# Patient Record
Sex: Female | Born: 1985 | Race: White | Hispanic: No | Marital: Married | Smoking: Never smoker
Health system: Southern US, Community
[De-identification: ages and names within clinical notes are randomized; demographics above are authoritative.]

## PROBLEM LIST (undated history)

## (undated) ENCOUNTER — Ambulatory Visit

## (undated) DIAGNOSIS — F32A Depression, unspecified: Secondary | ICD-10-CM

## (undated) DIAGNOSIS — F329 Major depressive disorder, single episode, unspecified: Secondary | ICD-10-CM

---

## 2011-10-02 ENCOUNTER — Other Ambulatory Visit (HOSPITAL_COMMUNITY)
Admission: RE | Admit: 2011-10-02 | Discharge: 2011-10-02 | Disposition: A | Discharge status: Home / Self Care | Payer: Self-pay | Source: Ambulatory Visit | Attending: Obstetrics and Gynecology | Admitting: Obstetrics and Gynecology

## 2011-10-02 DIAGNOSIS — Z01419 Encounter for gynecological examination (general) (routine) without abnormal findings: Secondary | ICD-10-CM | POA: Insufficient documentation

## 2016-08-18 ENCOUNTER — Ambulatory Visit (HOSPITAL_COMMUNITY)
Admission: EM | Admit: 2016-08-18 | Discharge: 2016-08-18 | Disposition: A | Discharge status: Home / Self Care | Payer: BLUE CROSS/BLUE SHIELD | Attending: Family Medicine | Admitting: Family Medicine

## 2016-08-18 ENCOUNTER — Encounter (HOSPITAL_COMMUNITY): Payer: Self-pay | Admitting: Emergency Medicine

## 2016-08-18 DIAGNOSIS — J069 Acute upper respiratory infection, unspecified: Secondary | ICD-10-CM

## 2016-08-18 DIAGNOSIS — R599 Enlarged lymph nodes, unspecified: Secondary | ICD-10-CM

## 2016-08-18 DIAGNOSIS — B349 Viral infection, unspecified: Secondary | ICD-10-CM | POA: Diagnosis not present

## 2016-08-18 DIAGNOSIS — B9789 Other viral agents as the cause of diseases classified elsewhere: Secondary | ICD-10-CM | POA: Diagnosis not present

## 2016-08-18 HISTORY — DX: Depression, unspecified: F32.A

## 2016-08-18 HISTORY — DX: Major depressive disorder, single episode, unspecified: F32.9

## 2016-08-18 MED ORDER — ACETAMINOPHEN 325 MG PO TABS
650.0000 mg | ORAL_TABLET | Freq: Once | ORAL | Status: AC
  Administered 2016-08-18: 650 mg via ORAL

## 2016-08-18 MED ORDER — ACETAMINOPHEN 325 MG PO TABS
ORAL_TABLET | ORAL | Status: AC
  Filled 2016-08-18: qty 2

## 2016-08-18 NOTE — Discharge Instructions (Signed)
APPLY HEAT  USE IBUPROFEN  FOLLOW UP WITH YOUR DOCTOR IF NEW OR WORSENING OF SYMPTOMS.

## 2016-08-18 NOTE — ED Provider Notes (Signed)
CSN: [id number]     Arrival date & time 08/18/16  1840 History   First MD Initiated Contact with Patient 08/18/16 1959     Chief Complaint  Patient presents with  . Fever   (Consider location/radiation/quality/duration/timing/severity/associated sxs/prior Treatment) HPI THIS IS A NEW PROBLEM. ONSET OF FEVER AND BODY ACHES THAT STARTED TONIGHT. ASSOC WITH BODY ACHES. NO AGGRAVATING OR ALLEVIATING FACTORS. NO TREATMENT AT HOME PAIN SCORE. FAMILY HISTORY: Past Medical History:  Diagnosis Date  . Depression    History reviewed. No pertinent surgical history. History reviewed. No pertinent family history. Social History  Substance Use Topics  . Smoking status: Never Smoker  . Smokeless tobacco: Never Used  . Alcohol use Yes     Comment: occassional   OB History    No data available     Review of Systems  Denies: HEADACHE, NAUSEA, ABDOMINAL PAIN, CHEST PAIN, CONGESTION, DYSURIA, SHORTNESS OF BREATH  Allergies  Review of patient's allergies indicates no known allergies.  Home Medications   Prior to Admission medications   Medication Sig Start Date End Date Taking? Authorizing Provider  escitalopram (LEXAPRO) 10 MG tablet Take 10 mg by mouth daily.   Yes Historical Provider, MD  Norethin Ace-Eth Estrad-FE (LOESTRIN 24 FE PO) Take by mouth.   Yes Historical Provider, MD   Meds Ordered and Administered this Visit  Medications - No data to display  BP 140/93 (BP Location: Left Arm)   Pulse 100   Temp 100.5 F (38.1 C) (Oral)   Resp 16   LMP  (Exact Date)   SpO2 100%  No data found.   Physical Exam NURSES NOTES AND VITAL SIGNS REVIEWED. CONSTITUTIONAL: Well developed, well nourished, no acute distress HEENT: normocephalic, atraumatic, SWOLLEN TENDER POST AURICULAR NODE ON LEFT EYES: Conjunctiva normal NECK:normal ROM, supple, no adenopathy PULMONARY:No respiratory distress, normal effort ABDOMINAL: Soft, ND, NT BS+, No CVAT MUSCULOSKELETAL: Normal ROM of all  extremities,  SKIN: warm and dry without rash PSYCHIATRIC: Mood and affect, behavior are normal  Urgent Care Course   Clinical Course    Procedures (including critical care time)  Labs Review Labs Reviewed - No data to display  Imaging Review No results found.   Visual Acuity Review  Right Eye Distance:   Left Eye Distance:   Bilateral Distance:    Right Eye Near:   Left Eye Near:    Bilateral Near:         MDM   1. Viral illness   2. Viral upper respiratory tract infection   3. Swelling of lymph node     Patient is reassured that there are no issues that require transfer to higher level of care at this time or additional tests. Patient is advised to continue home symptomatic treatment. Patient is advised that if there are new or worsening symptoms to attend the emergency department, contact primary care provider, or return to UC. Instructions of care provided discharged home in stable condition.    THIS NOTE WAS GENERATED USING A VOICE RECOGNITION SOFTWARE PROGRAM. ALL REASONABLE EFFORTS  WERE MADE TO PROOFREAD THIS DOCUMENT FOR ACCURACY.  I have verbally reviewed the discharge instructions with the patient. A printed AVS was given to the patient.  All questions were answered prior to discharge.      Tharon AquasFrank C Patrick, PA 08/18/16 2019

## 2016-08-18 NOTE — ED Triage Notes (Signed)
[facility] a [facility] fever and general body aches especially in her neck that started this evening. The patient reported no meds on board for the fever.

## 2016-09-01 DIAGNOSIS — F331 Major depressive disorder, recurrent, moderate: Secondary | ICD-10-CM | POA: Diagnosis not present

## 2016-09-21 DIAGNOSIS — F4323 Adjustment disorder with mixed anxiety and depressed mood: Secondary | ICD-10-CM | POA: Diagnosis not present

## 2016-10-05 DIAGNOSIS — F4323 Adjustment disorder with mixed anxiety and depressed mood: Secondary | ICD-10-CM | POA: Diagnosis not present

## 2016-10-19 DIAGNOSIS — F4323 Adjustment disorder with mixed anxiety and depressed mood: Secondary | ICD-10-CM | POA: Diagnosis not present

## 2016-11-02 DIAGNOSIS — F4323 Adjustment disorder with mixed anxiety and depressed mood: Secondary | ICD-10-CM | POA: Diagnosis not present

## 2016-11-17 DIAGNOSIS — J019 Acute sinusitis, unspecified: Secondary | ICD-10-CM | POA: Diagnosis not present

## 2016-11-20 DIAGNOSIS — F4323 Adjustment disorder with mixed anxiety and depressed mood: Secondary | ICD-10-CM | POA: Diagnosis not present

## 2016-11-30 DIAGNOSIS — F4323 Adjustment disorder with mixed anxiety and depressed mood: Secondary | ICD-10-CM | POA: Diagnosis not present

## 2016-12-07 DIAGNOSIS — J069 Acute upper respiratory infection, unspecified: Secondary | ICD-10-CM | POA: Diagnosis not present

## 2016-12-07 DIAGNOSIS — F331 Major depressive disorder, recurrent, moderate: Secondary | ICD-10-CM | POA: Diagnosis not present

## 2016-12-14 DIAGNOSIS — F4323 Adjustment disorder with mixed anxiety and depressed mood: Secondary | ICD-10-CM | POA: Diagnosis not present

## 2016-12-24 DIAGNOSIS — Z6827 Body mass index (BMI) 27.0-27.9, adult: Secondary | ICD-10-CM | POA: Diagnosis not present

## 2016-12-24 DIAGNOSIS — Z309 Encounter for contraceptive management, unspecified: Secondary | ICD-10-CM | POA: Diagnosis not present

## 2016-12-24 DIAGNOSIS — Z01419 Encounter for gynecological examination (general) (routine) without abnormal findings: Secondary | ICD-10-CM | POA: Diagnosis not present

## 2017-01-11 DIAGNOSIS — F4323 Adjustment disorder with mixed anxiety and depressed mood: Secondary | ICD-10-CM | POA: Diagnosis not present

## 2017-02-02 DIAGNOSIS — F4323 Adjustment disorder with mixed anxiety and depressed mood: Secondary | ICD-10-CM | POA: Diagnosis not present

## 2017-02-15 DIAGNOSIS — F4323 Adjustment disorder with mixed anxiety and depressed mood: Secondary | ICD-10-CM | POA: Diagnosis not present

## 2017-03-02 DIAGNOSIS — F4323 Adjustment disorder with mixed anxiety and depressed mood: Secondary | ICD-10-CM | POA: Diagnosis not present

## 2017-03-24 DIAGNOSIS — L814 Other melanin hyperpigmentation: Secondary | ICD-10-CM | POA: Diagnosis not present

## 2017-03-24 DIAGNOSIS — D225 Melanocytic nevi of trunk: Secondary | ICD-10-CM | POA: Diagnosis not present

## 2017-03-24 DIAGNOSIS — L821 Other seborrheic keratosis: Secondary | ICD-10-CM | POA: Diagnosis not present

## 2017-03-24 DIAGNOSIS — D1800 Hemangioma unspecified site: Secondary | ICD-10-CM | POA: Diagnosis not present

## 2017-04-01 DIAGNOSIS — F4323 Adjustment disorder with mixed anxiety and depressed mood: Secondary | ICD-10-CM | POA: Diagnosis not present

## 2017-04-14 DIAGNOSIS — F4323 Adjustment disorder with mixed anxiety and depressed mood: Secondary | ICD-10-CM | POA: Diagnosis not present

## 2017-05-04 DIAGNOSIS — F4323 Adjustment disorder with mixed anxiety and depressed mood: Secondary | ICD-10-CM | POA: Diagnosis not present

## 2017-06-07 DIAGNOSIS — F331 Major depressive disorder, recurrent, moderate: Secondary | ICD-10-CM | POA: Diagnosis not present

## 2017-06-22 DIAGNOSIS — F4323 Adjustment disorder with mixed anxiety and depressed mood: Secondary | ICD-10-CM | POA: Diagnosis not present

## 2017-07-12 DIAGNOSIS — F4323 Adjustment disorder with mixed anxiety and depressed mood: Secondary | ICD-10-CM | POA: Diagnosis not present

## 2017-07-26 DIAGNOSIS — F4323 Adjustment disorder with mixed anxiety and depressed mood: Secondary | ICD-10-CM | POA: Diagnosis not present

## 2017-08-17 DIAGNOSIS — F4323 Adjustment disorder with mixed anxiety and depressed mood: Secondary | ICD-10-CM | POA: Diagnosis not present

## 2017-10-15 DIAGNOSIS — J302 Other seasonal allergic rhinitis: Secondary | ICD-10-CM | POA: Diagnosis not present

## 2017-11-15 DIAGNOSIS — R300 Dysuria: Secondary | ICD-10-CM | POA: Diagnosis not present

## 2017-12-22 DIAGNOSIS — N390 Urinary tract infection, site not specified: Secondary | ICD-10-CM | POA: Diagnosis not present

## 2017-12-22 DIAGNOSIS — F331 Major depressive disorder, recurrent, moderate: Secondary | ICD-10-CM | POA: Diagnosis not present

## 2018-03-30 DIAGNOSIS — L821 Other seborrheic keratosis: Secondary | ICD-10-CM | POA: Diagnosis not present

## 2018-03-30 DIAGNOSIS — D1800 Hemangioma unspecified site: Secondary | ICD-10-CM | POA: Diagnosis not present

## 2018-03-30 DIAGNOSIS — L814 Other melanin hyperpigmentation: Secondary | ICD-10-CM | POA: Diagnosis not present

## 2018-03-30 DIAGNOSIS — D225 Melanocytic nevi of trunk: Secondary | ICD-10-CM | POA: Diagnosis not present

## 2018-06-21 DIAGNOSIS — F331 Major depressive disorder, recurrent, moderate: Secondary | ICD-10-CM | POA: Diagnosis not present

## 2018-06-21 DIAGNOSIS — Z23 Encounter for immunization: Secondary | ICD-10-CM | POA: Diagnosis not present

## 2018-12-08 DIAGNOSIS — J00 Acute nasopharyngitis [common cold]: Secondary | ICD-10-CM | POA: Diagnosis not present

## 2018-12-26 DIAGNOSIS — F331 Major depressive disorder, recurrent, moderate: Secondary | ICD-10-CM | POA: Diagnosis not present

## 2019-06-06 DIAGNOSIS — B078 Other viral warts: Secondary | ICD-10-CM | POA: Diagnosis not present

## 2019-06-06 DIAGNOSIS — L811 Chloasma: Secondary | ICD-10-CM | POA: Diagnosis not present

## 2019-06-06 DIAGNOSIS — D225 Melanocytic nevi of trunk: Secondary | ICD-10-CM | POA: Diagnosis not present

## 2019-06-06 DIAGNOSIS — L814 Other melanin hyperpigmentation: Secondary | ICD-10-CM | POA: Diagnosis not present

## 2019-06-06 DIAGNOSIS — D1801 Hemangioma of skin and subcutaneous tissue: Secondary | ICD-10-CM | POA: Diagnosis not present

## 2019-06-08 DIAGNOSIS — Z1159 Encounter for screening for other viral diseases: Secondary | ICD-10-CM | POA: Diagnosis not present

## 2019-06-26 DIAGNOSIS — F331 Major depressive disorder, recurrent, moderate: Secondary | ICD-10-CM | POA: Diagnosis not present

## 2019-07-03 DIAGNOSIS — B078 Other viral warts: Secondary | ICD-10-CM | POA: Diagnosis not present

## 2019-08-08 ENCOUNTER — Other Ambulatory Visit: Payer: Self-pay

## 2019-08-08 DIAGNOSIS — R6889 Other general symptoms and signs: Secondary | ICD-10-CM | POA: Diagnosis not present

## 2019-08-08 DIAGNOSIS — Z20822 Contact with and (suspected) exposure to covid-19: Secondary | ICD-10-CM

## 2019-08-09 LAB — NOVEL CORONAVIRUS, NAA: SARS-CoV-2, NAA: NOT DETECTED

## 2019-08-15 ENCOUNTER — Other Ambulatory Visit: Payer: Self-pay

## 2019-08-15 DIAGNOSIS — Z20822 Contact with and (suspected) exposure to covid-19: Secondary | ICD-10-CM

## 2019-08-16 LAB — NOVEL CORONAVIRUS, NAA: SARS-CoV-2, NAA: NOT DETECTED

## 2019-09-05 DIAGNOSIS — B078 Other viral warts: Secondary | ICD-10-CM | POA: Diagnosis not present

## 2019-12-12 DIAGNOSIS — L821 Other seborrheic keratosis: Secondary | ICD-10-CM | POA: Diagnosis not present

## 2019-12-12 DIAGNOSIS — B078 Other viral warts: Secondary | ICD-10-CM | POA: Diagnosis not present

## 2019-12-12 DIAGNOSIS — D2372 Other benign neoplasm of skin of left lower limb, including hip: Secondary | ICD-10-CM | POA: Diagnosis not present

## 2020-01-01 DIAGNOSIS — F3342 Major depressive disorder, recurrent, in full remission: Secondary | ICD-10-CM | POA: Diagnosis not present

## 2020-01-15 ENCOUNTER — Ambulatory Visit: Payer: BC Managed Care – PPO | Attending: Internal Medicine

## 2020-01-15 DIAGNOSIS — Z20822 Contact with and (suspected) exposure to covid-19: Secondary | ICD-10-CM | POA: Diagnosis not present

## 2020-01-17 LAB — NOVEL CORONAVIRUS, NAA: SARS-CoV-2, NAA: NOT DETECTED

## 2020-01-30 DIAGNOSIS — B078 Other viral warts: Secondary | ICD-10-CM | POA: Diagnosis not present

## 2020-01-30 DIAGNOSIS — L820 Inflamed seborrheic keratosis: Secondary | ICD-10-CM | POA: Diagnosis not present

## 2020-02-29 DIAGNOSIS — Z01419 Encounter for gynecological examination (general) (routine) without abnormal findings: Secondary | ICD-10-CM | POA: Diagnosis not present

## 2020-02-29 DIAGNOSIS — Z6832 Body mass index (BMI) 32.0-32.9, adult: Secondary | ICD-10-CM | POA: Diagnosis not present

## 2020-02-29 DIAGNOSIS — N941 Unspecified dyspareunia: Secondary | ICD-10-CM | POA: Diagnosis not present

## 2020-02-29 DIAGNOSIS — F419 Anxiety disorder, unspecified: Secondary | ICD-10-CM | POA: Diagnosis not present

## 2020-03-04 DIAGNOSIS — B078 Other viral warts: Secondary | ICD-10-CM | POA: Diagnosis not present

## 2020-03-08 DIAGNOSIS — N941 Unspecified dyspareunia: Secondary | ICD-10-CM | POA: Diagnosis not present

## 2020-03-08 DIAGNOSIS — K5900 Constipation, unspecified: Secondary | ICD-10-CM | POA: Diagnosis not present

## 2020-03-08 DIAGNOSIS — M62838 Other muscle spasm: Secondary | ICD-10-CM | POA: Diagnosis not present

## 2020-03-15 DIAGNOSIS — N941 Unspecified dyspareunia: Secondary | ICD-10-CM | POA: Diagnosis not present

## 2020-03-15 DIAGNOSIS — M62838 Other muscle spasm: Secondary | ICD-10-CM | POA: Diagnosis not present

## 2020-03-15 DIAGNOSIS — K5900 Constipation, unspecified: Secondary | ICD-10-CM | POA: Diagnosis not present

## 2020-03-22 DIAGNOSIS — N941 Unspecified dyspareunia: Secondary | ICD-10-CM | POA: Diagnosis not present

## 2020-03-22 DIAGNOSIS — M62838 Other muscle spasm: Secondary | ICD-10-CM | POA: Diagnosis not present

## 2020-03-22 DIAGNOSIS — K5900 Constipation, unspecified: Secondary | ICD-10-CM | POA: Diagnosis not present

## 2020-03-29 DIAGNOSIS — K5900 Constipation, unspecified: Secondary | ICD-10-CM | POA: Diagnosis not present

## 2020-03-29 DIAGNOSIS — N941 Unspecified dyspareunia: Secondary | ICD-10-CM | POA: Diagnosis not present

## 2020-03-29 DIAGNOSIS — M62838 Other muscle spasm: Secondary | ICD-10-CM | POA: Diagnosis not present

## 2020-04-10 DIAGNOSIS — M62838 Other muscle spasm: Secondary | ICD-10-CM | POA: Diagnosis not present

## 2020-04-10 DIAGNOSIS — K5900 Constipation, unspecified: Secondary | ICD-10-CM | POA: Diagnosis not present

## 2020-04-10 DIAGNOSIS — N941 Unspecified dyspareunia: Secondary | ICD-10-CM | POA: Diagnosis not present

## 2020-04-16 DIAGNOSIS — K5900 Constipation, unspecified: Secondary | ICD-10-CM | POA: Diagnosis not present

## 2020-04-16 DIAGNOSIS — N941 Unspecified dyspareunia: Secondary | ICD-10-CM | POA: Diagnosis not present

## 2020-04-16 DIAGNOSIS — M62838 Other muscle spasm: Secondary | ICD-10-CM | POA: Diagnosis not present

## 2020-04-22 DIAGNOSIS — K5900 Constipation, unspecified: Secondary | ICD-10-CM | POA: Diagnosis not present

## 2020-04-22 DIAGNOSIS — M62838 Other muscle spasm: Secondary | ICD-10-CM | POA: Diagnosis not present

## 2020-04-22 DIAGNOSIS — N941 Unspecified dyspareunia: Secondary | ICD-10-CM | POA: Diagnosis not present

## 2020-04-29 DIAGNOSIS — M62838 Other muscle spasm: Secondary | ICD-10-CM | POA: Diagnosis not present

## 2020-04-29 DIAGNOSIS — K5900 Constipation, unspecified: Secondary | ICD-10-CM | POA: Diagnosis not present

## 2020-04-29 DIAGNOSIS — N941 Unspecified dyspareunia: Secondary | ICD-10-CM | POA: Diagnosis not present

## 2020-04-30 DIAGNOSIS — L728 Other follicular cysts of the skin and subcutaneous tissue: Secondary | ICD-10-CM | POA: Diagnosis not present

## 2020-04-30 DIAGNOSIS — B078 Other viral warts: Secondary | ICD-10-CM | POA: Diagnosis not present

## 2020-05-13 ENCOUNTER — Ambulatory Visit: Payer: BC Managed Care – PPO | Attending: Internal Medicine

## 2020-05-13 DIAGNOSIS — Z20822 Contact with and (suspected) exposure to covid-19: Secondary | ICD-10-CM

## 2020-05-14 LAB — NOVEL CORONAVIRUS, NAA: SARS-CoV-2, NAA: NOT DETECTED

## 2020-05-14 LAB — SARS-COV-2, NAA 2 DAY TAT

## 2020-05-15 DIAGNOSIS — K5900 Constipation, unspecified: Secondary | ICD-10-CM | POA: Diagnosis not present

## 2020-05-15 DIAGNOSIS — M62838 Other muscle spasm: Secondary | ICD-10-CM | POA: Diagnosis not present

## 2020-05-15 DIAGNOSIS — N941 Unspecified dyspareunia: Secondary | ICD-10-CM | POA: Diagnosis not present

## 2020-05-29 DIAGNOSIS — M62838 Other muscle spasm: Secondary | ICD-10-CM | POA: Diagnosis not present

## 2020-05-29 DIAGNOSIS — N941 Unspecified dyspareunia: Secondary | ICD-10-CM | POA: Diagnosis not present

## 2020-05-29 DIAGNOSIS — K5900 Constipation, unspecified: Secondary | ICD-10-CM | POA: Diagnosis not present

## 2020-06-10 DIAGNOSIS — N941 Unspecified dyspareunia: Secondary | ICD-10-CM | POA: Diagnosis not present

## 2020-06-10 DIAGNOSIS — K5900 Constipation, unspecified: Secondary | ICD-10-CM | POA: Diagnosis not present

## 2020-06-10 DIAGNOSIS — M62838 Other muscle spasm: Secondary | ICD-10-CM | POA: Diagnosis not present

## 2020-06-19 DIAGNOSIS — K5900 Constipation, unspecified: Secondary | ICD-10-CM | POA: Diagnosis not present

## 2020-06-19 DIAGNOSIS — M62838 Other muscle spasm: Secondary | ICD-10-CM | POA: Diagnosis not present

## 2020-06-19 DIAGNOSIS — N941 Unspecified dyspareunia: Secondary | ICD-10-CM | POA: Diagnosis not present

## 2020-06-26 DIAGNOSIS — N941 Unspecified dyspareunia: Secondary | ICD-10-CM | POA: Diagnosis not present

## 2020-06-26 DIAGNOSIS — M62838 Other muscle spasm: Secondary | ICD-10-CM | POA: Diagnosis not present

## 2020-06-26 DIAGNOSIS — K5900 Constipation, unspecified: Secondary | ICD-10-CM | POA: Diagnosis not present

## 2020-07-17 DIAGNOSIS — N941 Unspecified dyspareunia: Secondary | ICD-10-CM | POA: Diagnosis not present

## 2020-07-17 DIAGNOSIS — M62838 Other muscle spasm: Secondary | ICD-10-CM | POA: Diagnosis not present

## 2020-07-17 DIAGNOSIS — K5900 Constipation, unspecified: Secondary | ICD-10-CM | POA: Diagnosis not present

## 2020-09-12 DIAGNOSIS — Z20822 Contact with and (suspected) exposure to covid-19: Secondary | ICD-10-CM | POA: Diagnosis not present

## 2020-10-11 DIAGNOSIS — R079 Chest pain, unspecified: Secondary | ICD-10-CM | POA: Diagnosis not present

## 2020-10-16 DIAGNOSIS — Z20822 Contact with and (suspected) exposure to covid-19: Secondary | ICD-10-CM | POA: Diagnosis not present

## 2020-10-23 DIAGNOSIS — J0140 Acute pansinusitis, unspecified: Secondary | ICD-10-CM | POA: Diagnosis not present

## 2020-10-23 DIAGNOSIS — R059 Cough, unspecified: Secondary | ICD-10-CM | POA: Diagnosis not present

## 2020-10-23 DIAGNOSIS — R5383 Other fatigue: Secondary | ICD-10-CM | POA: Diagnosis not present

## 2020-10-23 DIAGNOSIS — Z03818 Encounter for observation for suspected exposure to other biological agents ruled out: Secondary | ICD-10-CM | POA: Diagnosis not present

## 2020-10-23 DIAGNOSIS — Z20822 Contact with and (suspected) exposure to covid-19: Secondary | ICD-10-CM | POA: Diagnosis not present

## 2020-10-25 DIAGNOSIS — F3342 Major depressive disorder, recurrent, in full remission: Secondary | ICD-10-CM | POA: Diagnosis not present

## 2020-11-04 ENCOUNTER — Other Ambulatory Visit: Payer: BC Managed Care – PPO

## 2020-11-04 DIAGNOSIS — Z20822 Contact with and (suspected) exposure to covid-19: Secondary | ICD-10-CM | POA: Diagnosis not present

## 2020-11-06 LAB — SARS-COV-2, NAA 2 DAY TAT

## 2020-11-06 LAB — NOVEL CORONAVIRUS, NAA: SARS-CoV-2, NAA: NOT DETECTED

## 2021-04-18 DIAGNOSIS — T7840XA Allergy, unspecified, initial encounter: Secondary | ICD-10-CM | POA: Diagnosis not present

## 2021-04-18 DIAGNOSIS — F3341 Major depressive disorder, recurrent, in partial remission: Secondary | ICD-10-CM | POA: Diagnosis not present

## 2021-06-09 DIAGNOSIS — Z20822 Contact with and (suspected) exposure to covid-19: Secondary | ICD-10-CM | POA: Diagnosis not present

## 2021-08-13 DIAGNOSIS — L814 Other melanin hyperpigmentation: Secondary | ICD-10-CM | POA: Diagnosis not present

## 2021-08-13 DIAGNOSIS — D235 Other benign neoplasm of skin of trunk: Secondary | ICD-10-CM | POA: Diagnosis not present

## 2021-08-13 DIAGNOSIS — D225 Melanocytic nevi of trunk: Secondary | ICD-10-CM | POA: Diagnosis not present

## 2021-08-13 DIAGNOSIS — D485 Neoplasm of uncertain behavior of skin: Secondary | ICD-10-CM | POA: Diagnosis not present

## 2021-08-13 DIAGNOSIS — D179 Benign lipomatous neoplasm, unspecified: Secondary | ICD-10-CM | POA: Diagnosis not present

## 2021-08-13 DIAGNOSIS — D2372 Other benign neoplasm of skin of left lower limb, including hip: Secondary | ICD-10-CM | POA: Diagnosis not present

## 2021-08-13 DIAGNOSIS — L821 Other seborrheic keratosis: Secondary | ICD-10-CM | POA: Diagnosis not present

## 2021-10-20 DIAGNOSIS — F3341 Major depressive disorder, recurrent, in partial remission: Secondary | ICD-10-CM | POA: Diagnosis not present

## 2022-02-10 DIAGNOSIS — D2239 Melanocytic nevi of other parts of face: Secondary | ICD-10-CM | POA: Diagnosis not present

## 2022-02-10 DIAGNOSIS — D225 Melanocytic nevi of trunk: Secondary | ICD-10-CM | POA: Diagnosis not present

## 2022-02-10 DIAGNOSIS — D2271 Melanocytic nevi of right lower limb, including hip: Secondary | ICD-10-CM | POA: Diagnosis not present

## 2022-05-04 ENCOUNTER — Emergency Department (HOSPITAL_COMMUNITY): Payer: BC Managed Care – PPO

## 2022-05-04 ENCOUNTER — Emergency Department (HOSPITAL_COMMUNITY)
Admission: EM | Admit: 2022-05-04 | Discharge: 2022-05-05 | Discharge status: Against Medical Advice | Payer: BC Managed Care – PPO | Attending: Emergency Medicine | Admitting: Emergency Medicine

## 2022-05-04 ENCOUNTER — Encounter (HOSPITAL_COMMUNITY): Payer: Self-pay

## 2022-05-04 DIAGNOSIS — R109 Unspecified abdominal pain: Secondary | ICD-10-CM | POA: Diagnosis not present

## 2022-05-04 DIAGNOSIS — R0789 Other chest pain: Secondary | ICD-10-CM | POA: Diagnosis not present

## 2022-05-04 DIAGNOSIS — R079 Chest pain, unspecified: Secondary | ICD-10-CM | POA: Diagnosis not present

## 2022-05-04 DIAGNOSIS — Z5321 Procedure and treatment not carried out due to patient leaving prior to being seen by health care provider: Secondary | ICD-10-CM | POA: Insufficient documentation

## 2022-05-04 LAB — BASIC METABOLIC PANEL
Anion gap: 5 (ref 5–15)
BUN: 10 mg/dL (ref 6–20)
CO2: 25 mmol/L (ref 22–32)
Calcium: 9.2 mg/dL (ref 8.9–10.3)
Chloride: 109 mmol/L (ref 98–111)
Creatinine, Ser: 0.81 mg/dL (ref 0.44–1.00)
GFR, Estimated: >60 mL/min (ref >60)
Glucose, Bld: 108 mg/dL — ABNORMAL HIGH (ref 70–99)
Potassium: 4.2 mmol/L (ref 3.5–5.1)
Sodium: 139 mmol/L (ref 135–145)

## 2022-05-04 LAB — CBC
HCT: 38.4 % (ref 36.0–46.0)
Hemoglobin: 12.1 g/dL (ref 12.0–15.0)
MCH: 27.4 pg (ref 26.0–34.0)
MCHC: 31.5 g/dL (ref 30.0–36.0)
MCV: 87.1 fL (ref 80.0–100.0)
Platelets: 414 10*3/uL — ABNORMAL HIGH (ref 150–400)
RBC: 4.41 MIL/uL (ref 3.87–5.11)
RDW: 15.8 % — ABNORMAL HIGH (ref 11.5–15.5)
WBC: 9.2 10*3/uL (ref 4.0–10.5)
nRBC: 0 % (ref 0.0–0.2)

## 2022-05-04 LAB — I-STAT BETA HCG BLOOD, ED (MC, WL, AP ONLY): I-stat hCG, quantitative: <5 m[IU]/mL (ref <5)

## 2022-05-04 LAB — TROPONIN I (HIGH SENSITIVITY): Troponin I (High Sensitivity): <2 ng/L (ref <18)

## 2022-05-04 NOTE — ED Provider Triage Note (Signed)
Emergency Medicine Provider Triage Evaluation Note  MULKI ROESLER , a 36 y.o. female  was evaluated in triage.  Pt complains of left chest pain. She states that same was present when she woke up this morning. She states that the pain is sharp in nature and located underneath her left breast. She states that the pain is pleuritic in nature and does not radiate. Denies any leg pain, recent travel, or history of blood clots.  Review of Systems  Positive:  Negative:   Physical Exam  BP (!) 177/101   Pulse 87   Temp 98.3 F (36.8 C) (Oral)   Resp (!) 22   SpO2 100%  Gen:   Awake, no distress   Resp:  Normal effort  MSK:   Moves extremities without difficulty  Other:    Medical Decision Making  Medically screening exam initiated at 7:00 PM.  Appropriate orders placed.  ALKA FALWELL was informed that the remainder of the evaluation will be completed by another provider, this initial triage assessment does not replace that evaluation, and the importance of remaining in the ED until their evaluation is complete.     Vear Clock 05/04/22 1901

## 2022-05-04 NOTE — ED Triage Notes (Signed)
Pt arrived via POV, c/o left sided flank pain that started today.

## 2022-05-05 NOTE — ED Notes (Signed)
No answer- PT did not response for repeat labs

## 2022-05-06 DIAGNOSIS — R079 Chest pain, unspecified: Secondary | ICD-10-CM | POA: Diagnosis not present

## 2022-05-06 DIAGNOSIS — F418 Other specified anxiety disorders: Secondary | ICD-10-CM | POA: Diagnosis not present

## 2022-11-10 IMAGING — CR DG CHEST 2V
2 series · 2 of 2 positions shown · non-contrast
Comparison: None Available.

CLINICAL DATA: Chest pain.

EXAM:
CHEST - 2 VIEW

[w chest pa]
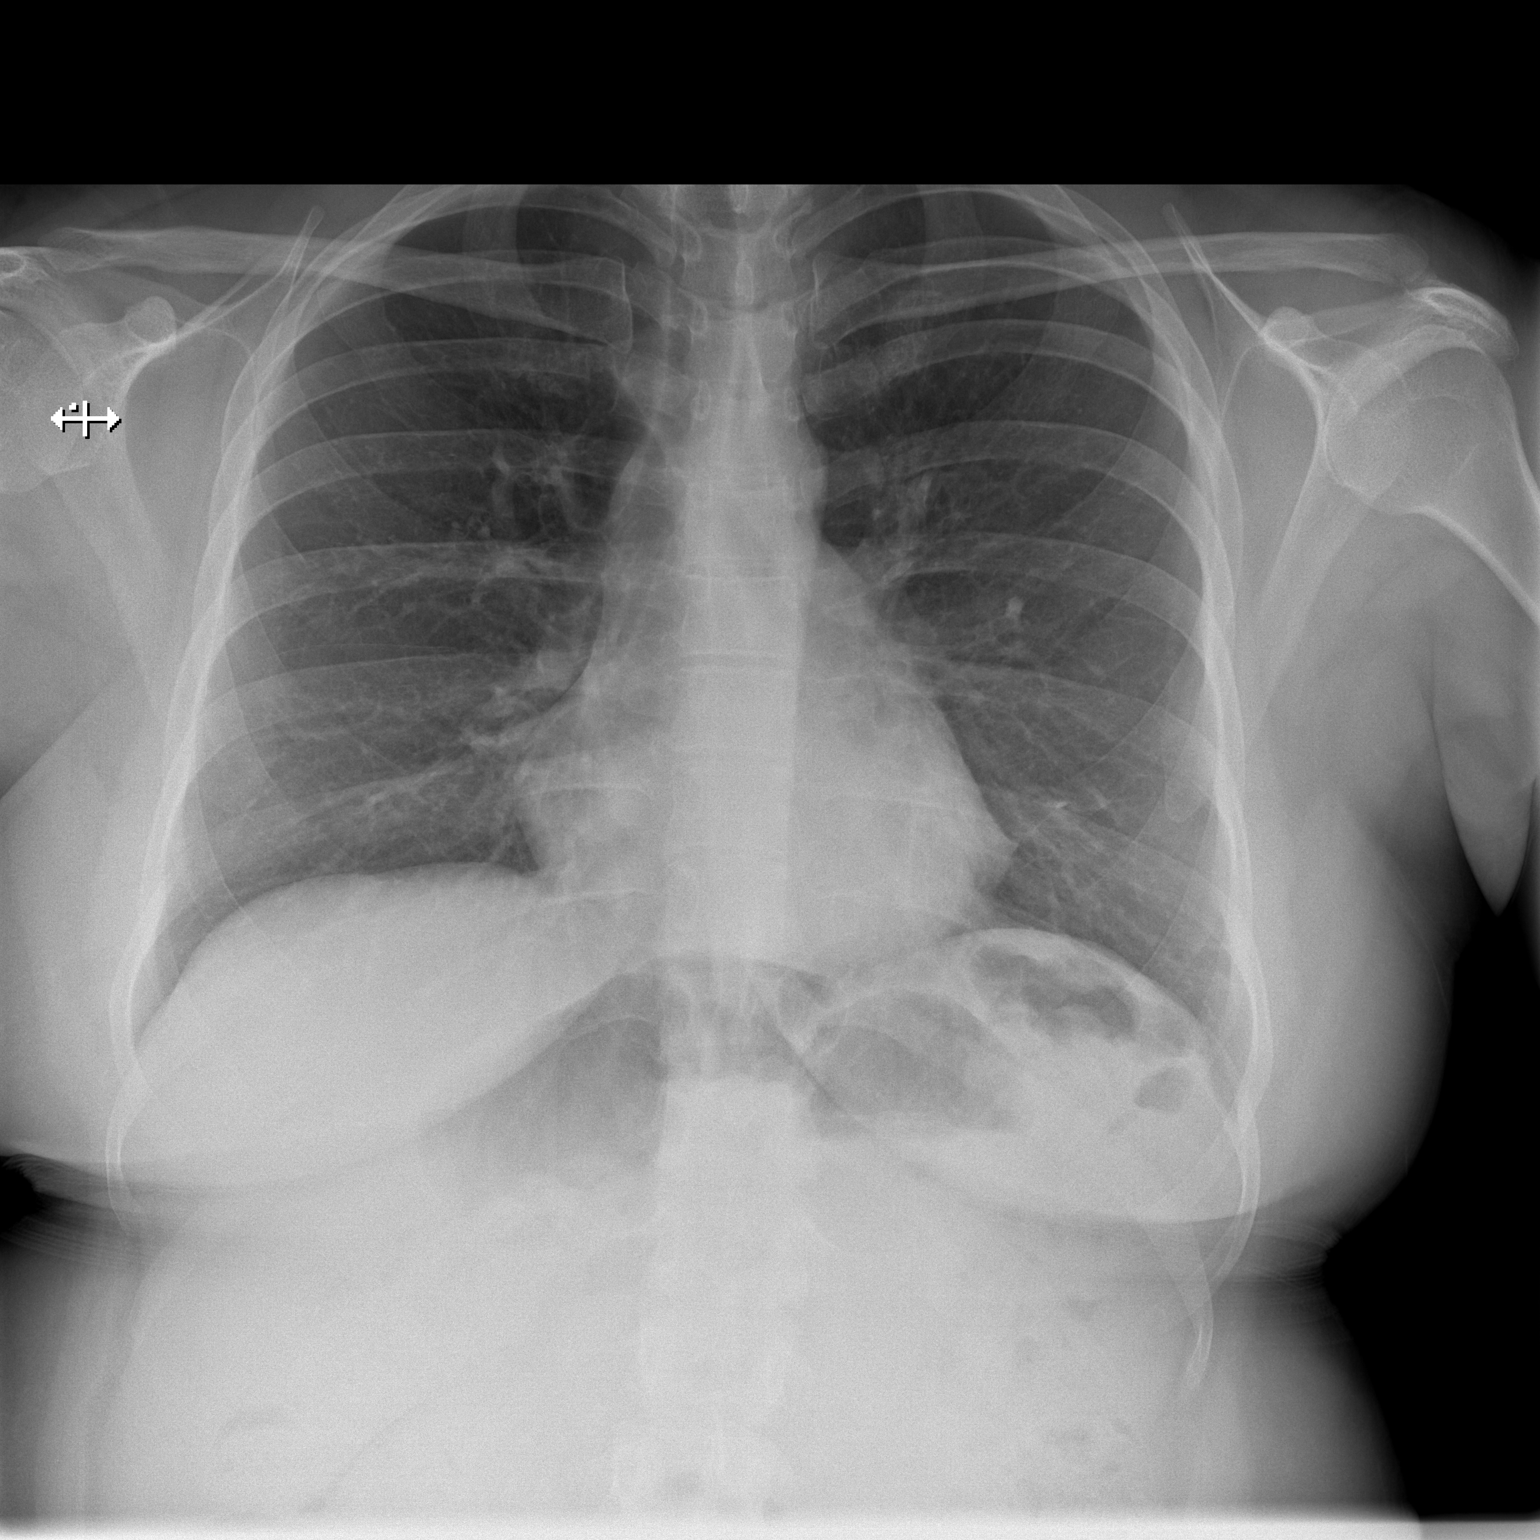

[w chest lat]
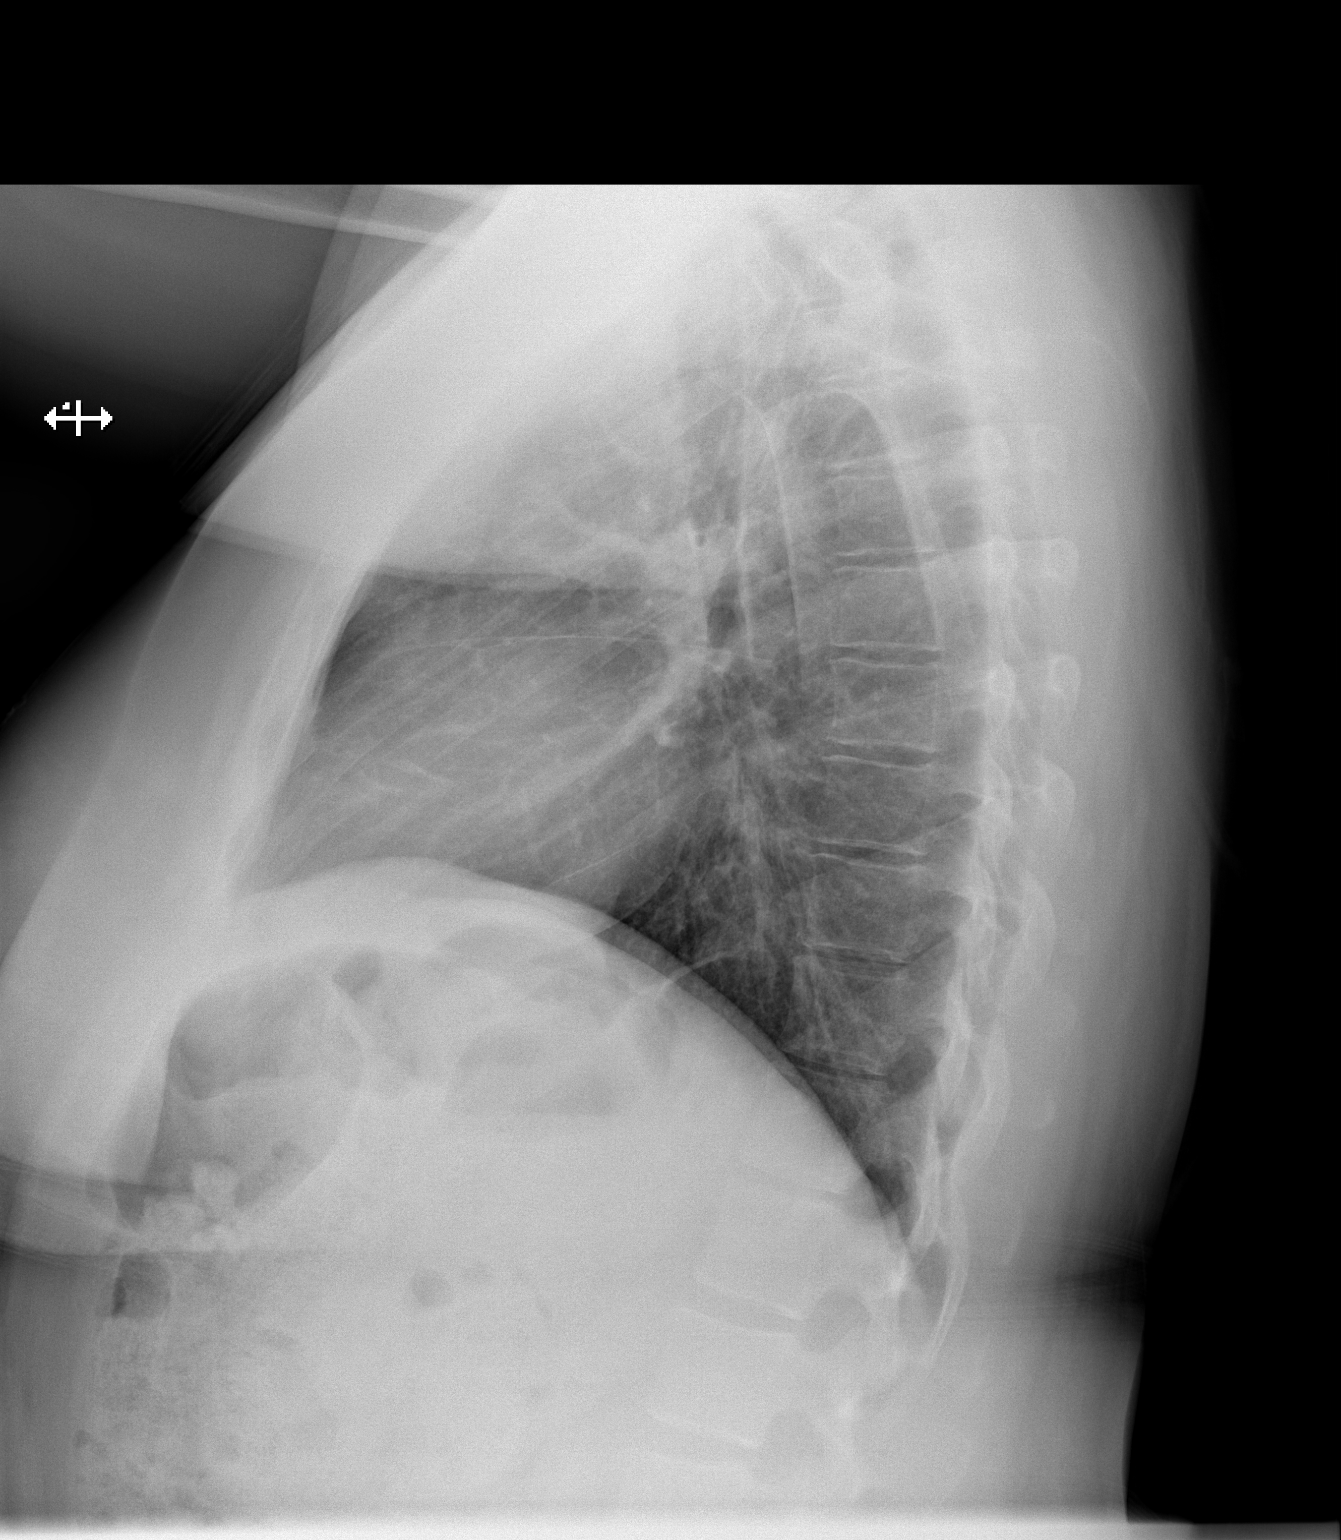

[2 of 2 positions shown; findings below may reference images not displayed]

FINDINGS: The heart size and mediastinal contours are within normal limits.
Both lungs are clear. The visualized skeletal structures are
unremarkable.
IMPRESSION: No active cardiopulmonary disease.

## 2022-12-14 ENCOUNTER — Other Ambulatory Visit (HOSPITAL_COMMUNITY): Payer: Self-pay | Admitting: Otolaryngology

## 2022-12-14 DIAGNOSIS — H9312 Tinnitus, left ear: Secondary | ICD-10-CM | POA: Diagnosis not present

## 2022-12-14 DIAGNOSIS — H93A9 Pulsatile tinnitus, unspecified ear: Secondary | ICD-10-CM

## 2022-12-14 DIAGNOSIS — J309 Allergic rhinitis, unspecified: Secondary | ICD-10-CM | POA: Diagnosis not present

## 2022-12-14 DIAGNOSIS — J343 Hypertrophy of nasal turbinates: Secondary | ICD-10-CM | POA: Diagnosis not present

## 2022-12-17 ENCOUNTER — Ambulatory Visit (HOSPITAL_COMMUNITY): Payer: BC Managed Care – PPO

## 2022-12-17 ENCOUNTER — Encounter (HOSPITAL_COMMUNITY): Payer: Self-pay

## 2023-01-04 DIAGNOSIS — F341 Dysthymic disorder: Secondary | ICD-10-CM | POA: Diagnosis not present

## 2023-01-04 DIAGNOSIS — F411 Generalized anxiety disorder: Secondary | ICD-10-CM | POA: Diagnosis not present

## 2023-01-08 DIAGNOSIS — L814 Other melanin hyperpigmentation: Secondary | ICD-10-CM | POA: Diagnosis not present

## 2023-01-08 DIAGNOSIS — D235 Other benign neoplasm of skin of trunk: Secondary | ICD-10-CM | POA: Diagnosis not present

## 2023-01-08 DIAGNOSIS — D2372 Other benign neoplasm of skin of left lower limb, including hip: Secondary | ICD-10-CM | POA: Diagnosis not present

## 2023-01-08 DIAGNOSIS — L821 Other seborrheic keratosis: Secondary | ICD-10-CM | POA: Diagnosis not present

## 2023-02-03 DIAGNOSIS — F411 Generalized anxiety disorder: Secondary | ICD-10-CM | POA: Diagnosis not present

## 2023-02-03 DIAGNOSIS — F341 Dysthymic disorder: Secondary | ICD-10-CM | POA: Diagnosis not present

## 2023-03-16 DIAGNOSIS — N920 Excessive and frequent menstruation with regular cycle: Secondary | ICD-10-CM | POA: Diagnosis not present

## 2023-03-16 DIAGNOSIS — Z309 Encounter for contraceptive management, unspecified: Secondary | ICD-10-CM | POA: Diagnosis not present

## 2023-03-24 DIAGNOSIS — F411 Generalized anxiety disorder: Secondary | ICD-10-CM | POA: Diagnosis not present

## 2023-03-24 DIAGNOSIS — F341 Dysthymic disorder: Secondary | ICD-10-CM | POA: Diagnosis not present

## 2023-03-29 DIAGNOSIS — F341 Dysthymic disorder: Secondary | ICD-10-CM | POA: Diagnosis not present

## 2023-03-29 DIAGNOSIS — F411 Generalized anxiety disorder: Secondary | ICD-10-CM | POA: Diagnosis not present

## 2023-04-02 DIAGNOSIS — F341 Dysthymic disorder: Secondary | ICD-10-CM | POA: Diagnosis not present

## 2023-04-02 DIAGNOSIS — F411 Generalized anxiety disorder: Secondary | ICD-10-CM | POA: Diagnosis not present

## 2023-04-03 DIAGNOSIS — R519 Headache, unspecified: Secondary | ICD-10-CM | POA: Diagnosis not present

## 2023-04-03 DIAGNOSIS — H538 Other visual disturbances: Secondary | ICD-10-CM | POA: Diagnosis not present

## 2023-04-09 DIAGNOSIS — H4710 Unspecified papilledema: Secondary | ICD-10-CM | POA: Diagnosis not present

## 2023-04-09 DIAGNOSIS — F341 Dysthymic disorder: Secondary | ICD-10-CM | POA: Diagnosis not present

## 2023-04-09 DIAGNOSIS — F411 Generalized anxiety disorder: Secondary | ICD-10-CM | POA: Diagnosis not present

## 2023-04-13 ENCOUNTER — Encounter: Payer: Self-pay | Admitting: Diagnostic Neuroimaging

## 2023-04-13 ENCOUNTER — Ambulatory Visit: Payer: BC Managed Care – PPO | Admitting: Diagnostic Neuroimaging

## 2023-04-13 VITALS — BP 113/80 | HR 85 | Ht 66.0 in | Wt 214.0 lb

## 2023-04-13 DIAGNOSIS — G4489 Other headache syndrome: Secondary | ICD-10-CM

## 2023-04-13 DIAGNOSIS — H4710 Unspecified papilledema: Secondary | ICD-10-CM

## 2023-04-13 NOTE — Progress Notes (Signed)
[facility]  PATIENT: Brandy Gilmore DOB: 1986-04-16  REFERRING CLINICIAN: Windell Norfolk, OD HISTORY FROM: patient  REASON FOR VISIT: new consult   HISTORICAL  CHIEF COMPLAINT:  Chief Complaint  Patient presents with   New Patient (Initial Visit)    Rm 7, Alone, Pt is here for eyes swollen Rm optic nerve, Eye exam on 04/07/2023. Blurry vision, on and off discomfort.    HISTORY OF PRESENT ILLNESS:   37 year old female here for evaluation of papilledema.  January 2024 patient had sudden onset of tinnitus in left greater than right side.  She felt like this was her heartbeat.  She saw ENT and no specific cause was found.  Symptoms continue to fluctuate.  Few weeks ago patient had worsening blurred vision and pressure-like headaches on the back of her head and neck.  She went to optometrist to noted bilateral papilledema.  Patient referred here for further evaluation.  No nausea or vomiting.  No sensitive light or sound.  No history of migraine headache.  Of note patient had started oral contraceptive a few weeks prior to onset of headache symptoms.  No recent change in weight.  No other change in activity, diet or exercise.  No accidents injuries or traumas.    REVIEW OF SYSTEMS: Full 14 system review of systems performed and negative with exception of: as per HPI.  ALLERGIES: No Known Allergies  HOME MEDICATIONS: Outpatient Medications Prior to Visit  Medication Sig Dispense Refill   busPIRone (BUSPAR) 15 MG tablet Take 15 mg by mouth 2 (two) times daily.     desvenlafaxine (PRISTIQ) 50 MG 24 hr tablet Take 50 mg by mouth daily.     ferrous sulfate 325 (65 FE) MG EC tablet Take 325 mg by mouth 3 (three) times daily with meals.     lactobacillus acidophilus (BACID) TABS tablet Take 2 tablets by mouth 3 (three) times daily.     escitalopram (LEXAPRO) 10 MG tablet Take 10 mg by mouth daily. (Patient not taking: Reported on 04/13/2023)     Norethin  Ace-Eth Estrad-FE (LOESTRIN 24 FE PO) Take by mouth.     No facility-administered medications prior to visit.    PAST MEDICAL HISTORY: Past Medical History:  Diagnosis Date   Depression     PAST SURGICAL HISTORY: History reviewed. No pertinent surgical history.  FAMILY HISTORY: History reviewed. No pertinent family history.  SOCIAL HISTORY: Social History   Socioeconomic History   Marital status: Married    Spouse name: Not on file   Number of children: Not on file   Years of education: Not on file   Highest education level: Not on file  Occupational History   Not on file  Tobacco Use   Smoking status: Never   Smokeless tobacco: Never  Substance and Sexual Activity   Alcohol use: Yes    Comment: occassional   Drug use: No   Sexual activity: Not on file  Other Topics Concern   Not on file  Social History Narrative   Not on file   Social Determinants of Health   Financial Resource Strain: Not on file  Food Insecurity: Not on file  Transportation Needs: Not on file  Physical Activity: Not on file  Stress: Not on file  Social Connections: Not on file  Intimate Partner Violence: Not on file     PHYSICAL EXAM  GENERAL EXAM/CONSTITUTIONAL: Vitals:  Vitals:   04/13/23 0826  BP: 113/80  Pulse: 85  Weight: 214 lb (97.1  kg)  Height: 5\' 6"  (1.676 m)   Body mass index is 34.54 kg/m. Wt Readings from Last 3 Encounters:  04/13/23 214 lb (97.1 kg)   Patient is in no distress; well developed, nourished and groomed; neck is supple  CARDIOVASCULAR: Examination of carotid arteries is normal; no carotid bruits Regular rate and rhythm, no murmurs Examination of peripheral vascular system by observation and palpation is normal  EYES: Ophthalmoscopic exam of optic discs and posterior segments is normal; no papilledema or hemorrhages No results found.  MUSCULOSKELETAL: Gait, strength, tone, movements noted in Neurologic exam below  NEUROLOGIC: MENTAL STATUS:       No data to display         awake, alert, oriented to person, place and time recent and remote memory intact normal attention and concentration language fluent, comprehension intact, naming intact fund of knowledge appropriate  CRANIAL NERVE:  2nd - MILD papilledema on fundoscopic exam 2nd, 3rd, 4th, 6th - pupils equal and reactive to light, visual fields full to confrontation, extraocular muscles intact, no nystagmus 5th - facial sensation symmetric 7th - facial strength symmetric 8th - hearing intact 9th - palate elevates symmetrically, uvula midline 11th - shoulder shrug symmetric 12th - tongue protrusion midline  MOTOR:  normal bulk and tone, full strength in the BUE, BLE  SENSORY:  normal and symmetric to light touch, temperature, vibration  COORDINATION:  finger-nose-finger, fine finger movements normal  REFLEXES:  deep tendon reflexes present and symmetric  GAIT/STATION:  narrow based gait     DIAGNOSTIC DATA (LABS, IMAGING, TESTING) - I reviewed patient records, labs, notes, testing and imaging myself where available.  Lab Results  Component Value Date   WBC 9.2 05/04/2022   HGB 12.1 05/04/2022   HCT 38.4 05/04/2022   MCV 87.1 05/04/2022   PLT 414 (H) 05/04/2022      Component Value Date/Time   NA 139 05/04/2022 1959   K 4.2 05/04/2022 1959   CL 109 05/04/2022 1959   CO2 25 05/04/2022 1959   GLUCOSE 108 (H) 05/04/2022 1959   BUN 10 05/04/2022 1959   CREATININE 0.81 05/04/2022 1959   CALCIUM 9.2 05/04/2022 1959   GFRNONAA >60 05/04/2022 1959   No results found for: "CHOL", "HDL", "LDLCALC", "LDLDIRECT", "TRIG", "CHOLHDL" No results found for: "HGBA1C" No results found for: "VITAMINB12" No results found for: "TSH"     ASSESSMENT AND PLAN  37 y.o. year old female here with:   Dx:  1. Papilledema   2. Other headache syndrome     PLAN:  PAPILLEDEMA / BLURRED VISION / HEADACHES (since May 2024), TINNITUS (since Jan 2024) -->  suspicious for idiopathic intracranial hypertension (pseudotumor cerebri)  - check MRI brain, MRV head - then consider LP for opening pressure - then consider acetazolamide  Orders Placed This Encounter  Procedures   MR BRAIN W WO CONTRAST   MR MRV HEAD WO CM   Return in about 3 months (around 07/14/2023).  I reviewed images, labs, notes, records myself. I summarized findings and reviewed with patient, for this high risk condition (papilledema, vision changes, headaches) requiring high complexity decision making.    Suanne Marker, MD 04/13/2023, 9:24 AM Certified in Neurology, Neurophysiology and Neuroimaging  [facility] [address], [address] [place], [place] [address] [phone]

## 2023-04-13 NOTE — Patient Instructions (Signed)
-   MRI brain, MRV head - then consider LP for opening pressure - then consider acetazolamide

## 2023-04-14 ENCOUNTER — Ambulatory Visit (INDEPENDENT_AMBULATORY_CARE_PROVIDER_SITE_OTHER): Payer: BC Managed Care – PPO

## 2023-04-14 DIAGNOSIS — H4710 Unspecified papilledema: Secondary | ICD-10-CM

## 2023-04-14 DIAGNOSIS — G4489 Other headache syndrome: Secondary | ICD-10-CM | POA: Diagnosis not present

## 2023-04-14 MED ORDER — GADOBENATE DIMEGLUMINE 529 MG/ML IV SOLN
20.0000 mL | Freq: Once | INTRAVENOUS | Status: AC | PRN
  Administered 2023-04-14: 20 mL via INTRAVENOUS

## 2023-04-15 ENCOUNTER — Encounter: Payer: Self-pay | Admitting: Anesthesiology

## 2023-04-23 DIAGNOSIS — F341 Dysthymic disorder: Secondary | ICD-10-CM | POA: Diagnosis not present

## 2023-04-23 DIAGNOSIS — F411 Generalized anxiety disorder: Secondary | ICD-10-CM | POA: Diagnosis not present

## 2023-04-30 DIAGNOSIS — F411 Generalized anxiety disorder: Secondary | ICD-10-CM | POA: Diagnosis not present

## 2023-04-30 DIAGNOSIS — F341 Dysthymic disorder: Secondary | ICD-10-CM | POA: Diagnosis not present

## 2023-05-07 DIAGNOSIS — F341 Dysthymic disorder: Secondary | ICD-10-CM | POA: Diagnosis not present

## 2023-05-07 DIAGNOSIS — F411 Generalized anxiety disorder: Secondary | ICD-10-CM | POA: Diagnosis not present

## 2023-05-14 DIAGNOSIS — F411 Generalized anxiety disorder: Secondary | ICD-10-CM | POA: Diagnosis not present

## 2023-05-14 DIAGNOSIS — F341 Dysthymic disorder: Secondary | ICD-10-CM | POA: Diagnosis not present

## 2023-05-17 DIAGNOSIS — F411 Generalized anxiety disorder: Secondary | ICD-10-CM | POA: Diagnosis not present

## 2023-05-17 DIAGNOSIS — F341 Dysthymic disorder: Secondary | ICD-10-CM | POA: Diagnosis not present

## 2023-05-18 DIAGNOSIS — H4710 Unspecified papilledema: Secondary | ICD-10-CM | POA: Diagnosis not present

## 2023-05-28 DIAGNOSIS — F341 Dysthymic disorder: Secondary | ICD-10-CM | POA: Diagnosis not present

## 2023-05-28 DIAGNOSIS — F411 Generalized anxiety disorder: Secondary | ICD-10-CM | POA: Diagnosis not present

## 2023-06-04 DIAGNOSIS — F341 Dysthymic disorder: Secondary | ICD-10-CM | POA: Diagnosis not present

## 2023-06-04 DIAGNOSIS — F411 Generalized anxiety disorder: Secondary | ICD-10-CM | POA: Diagnosis not present

## 2023-06-18 DIAGNOSIS — F341 Dysthymic disorder: Secondary | ICD-10-CM | POA: Diagnosis not present

## 2023-06-18 DIAGNOSIS — F411 Generalized anxiety disorder: Secondary | ICD-10-CM | POA: Diagnosis not present

## 2023-07-02 DIAGNOSIS — F341 Dysthymic disorder: Secondary | ICD-10-CM | POA: Diagnosis not present

## 2023-07-02 DIAGNOSIS — F411 Generalized anxiety disorder: Secondary | ICD-10-CM | POA: Diagnosis not present

## 2023-07-07 DIAGNOSIS — H4710 Unspecified papilledema: Secondary | ICD-10-CM | POA: Diagnosis not present

## 2023-07-09 DIAGNOSIS — F341 Dysthymic disorder: Secondary | ICD-10-CM | POA: Diagnosis not present

## 2023-07-09 DIAGNOSIS — F411 Generalized anxiety disorder: Secondary | ICD-10-CM | POA: Diagnosis not present

## 2023-07-16 DIAGNOSIS — F411 Generalized anxiety disorder: Secondary | ICD-10-CM | POA: Diagnosis not present

## 2023-07-16 DIAGNOSIS — F341 Dysthymic disorder: Secondary | ICD-10-CM | POA: Diagnosis not present

## 2023-07-23 DIAGNOSIS — F411 Generalized anxiety disorder: Secondary | ICD-10-CM | POA: Diagnosis not present

## 2023-07-23 DIAGNOSIS — F341 Dysthymic disorder: Secondary | ICD-10-CM | POA: Diagnosis not present

## 2023-07-30 DIAGNOSIS — F341 Dysthymic disorder: Secondary | ICD-10-CM | POA: Diagnosis not present

## 2023-07-30 DIAGNOSIS — F411 Generalized anxiety disorder: Secondary | ICD-10-CM | POA: Diagnosis not present

## 2023-08-16 DIAGNOSIS — F411 Generalized anxiety disorder: Secondary | ICD-10-CM | POA: Diagnosis not present

## 2023-08-16 DIAGNOSIS — F341 Dysthymic disorder: Secondary | ICD-10-CM | POA: Diagnosis not present

## 2023-09-10 DIAGNOSIS — F411 Generalized anxiety disorder: Secondary | ICD-10-CM | POA: Diagnosis not present

## 2023-09-10 DIAGNOSIS — F341 Dysthymic disorder: Secondary | ICD-10-CM | POA: Diagnosis not present

## 2023-09-15 DIAGNOSIS — Z01419 Encounter for gynecological examination (general) (routine) without abnormal findings: Secondary | ICD-10-CM | POA: Diagnosis not present

## 2023-09-15 DIAGNOSIS — Z6834 Body mass index (BMI) 34.0-34.9, adult: Secondary | ICD-10-CM | POA: Diagnosis not present

## 2023-09-24 DIAGNOSIS — F411 Generalized anxiety disorder: Secondary | ICD-10-CM | POA: Diagnosis not present

## 2023-09-24 DIAGNOSIS — F341 Dysthymic disorder: Secondary | ICD-10-CM | POA: Diagnosis not present

## 2023-10-20 DIAGNOSIS — H4710 Unspecified papilledema: Secondary | ICD-10-CM | POA: Diagnosis not present

## 2023-10-22 DIAGNOSIS — F341 Dysthymic disorder: Secondary | ICD-10-CM | POA: Diagnosis not present

## 2023-10-22 DIAGNOSIS — F411 Generalized anxiety disorder: Secondary | ICD-10-CM | POA: Diagnosis not present

## 2023-10-29 DIAGNOSIS — F411 Generalized anxiety disorder: Secondary | ICD-10-CM | POA: Diagnosis not present

## 2023-10-29 DIAGNOSIS — F341 Dysthymic disorder: Secondary | ICD-10-CM | POA: Diagnosis not present

## 2023-11-05 DIAGNOSIS — F341 Dysthymic disorder: Secondary | ICD-10-CM | POA: Diagnosis not present

## 2023-11-05 DIAGNOSIS — F411 Generalized anxiety disorder: Secondary | ICD-10-CM | POA: Diagnosis not present

## 2023-11-12 DIAGNOSIS — F411 Generalized anxiety disorder: Secondary | ICD-10-CM | POA: Diagnosis not present

## 2023-11-12 DIAGNOSIS — F341 Dysthymic disorder: Secondary | ICD-10-CM | POA: Diagnosis not present

## 2023-11-15 DIAGNOSIS — F341 Dysthymic disorder: Secondary | ICD-10-CM | POA: Diagnosis not present

## 2023-11-15 DIAGNOSIS — F411 Generalized anxiety disorder: Secondary | ICD-10-CM | POA: Diagnosis not present

## 2023-11-23 DIAGNOSIS — Z124 Encounter for screening for malignant neoplasm of cervix: Secondary | ICD-10-CM | POA: Diagnosis not present

## 2023-11-23 DIAGNOSIS — Z1151 Encounter for screening for human papillomavirus (HPV): Secondary | ICD-10-CM | POA: Diagnosis not present

## 2023-11-23 DIAGNOSIS — N946 Dysmenorrhea, unspecified: Secondary | ICD-10-CM | POA: Diagnosis not present

## 2023-12-03 DIAGNOSIS — F341 Dysthymic disorder: Secondary | ICD-10-CM | POA: Diagnosis not present

## 2023-12-03 DIAGNOSIS — F411 Generalized anxiety disorder: Secondary | ICD-10-CM | POA: Diagnosis not present

## 2023-12-07 DIAGNOSIS — N946 Dysmenorrhea, unspecified: Secondary | ICD-10-CM | POA: Diagnosis not present

## 2023-12-07 DIAGNOSIS — N926 Irregular menstruation, unspecified: Secondary | ICD-10-CM | POA: Diagnosis not present

## 2023-12-17 DIAGNOSIS — F341 Dysthymic disorder: Secondary | ICD-10-CM | POA: Diagnosis not present

## 2023-12-17 DIAGNOSIS — F411 Generalized anxiety disorder: Secondary | ICD-10-CM | POA: Diagnosis not present

## 2023-12-31 DIAGNOSIS — F341 Dysthymic disorder: Secondary | ICD-10-CM | POA: Diagnosis not present

## 2023-12-31 DIAGNOSIS — F411 Generalized anxiety disorder: Secondary | ICD-10-CM | POA: Diagnosis not present

## 2024-01-07 DIAGNOSIS — F411 Generalized anxiety disorder: Secondary | ICD-10-CM | POA: Diagnosis not present

## 2024-01-07 DIAGNOSIS — F341 Dysthymic disorder: Secondary | ICD-10-CM | POA: Diagnosis not present

## 2024-01-12 DIAGNOSIS — L0101 Non-bullous impetigo: Secondary | ICD-10-CM | POA: Diagnosis not present

## 2024-01-12 DIAGNOSIS — L821 Other seborrheic keratosis: Secondary | ICD-10-CM | POA: Diagnosis not present

## 2024-01-12 DIAGNOSIS — D235 Other benign neoplasm of skin of trunk: Secondary | ICD-10-CM | POA: Diagnosis not present

## 2024-01-12 DIAGNOSIS — L814 Other melanin hyperpigmentation: Secondary | ICD-10-CM | POA: Diagnosis not present

## 2024-01-12 DIAGNOSIS — D2372 Other benign neoplasm of skin of left lower limb, including hip: Secondary | ICD-10-CM | POA: Diagnosis not present

## 2024-01-28 DIAGNOSIS — F411 Generalized anxiety disorder: Secondary | ICD-10-CM | POA: Diagnosis not present

## 2024-01-28 DIAGNOSIS — F341 Dysthymic disorder: Secondary | ICD-10-CM | POA: Diagnosis not present

## 2024-02-02 DIAGNOSIS — F341 Dysthymic disorder: Secondary | ICD-10-CM | POA: Diagnosis not present

## 2024-02-02 DIAGNOSIS — F411 Generalized anxiety disorder: Secondary | ICD-10-CM | POA: Diagnosis not present

## 2024-02-03 DIAGNOSIS — L0101 Non-bullous impetigo: Secondary | ICD-10-CM | POA: Diagnosis not present

## 2024-02-03 DIAGNOSIS — L980 Pyogenic granuloma: Secondary | ICD-10-CM | POA: Diagnosis not present

## 2024-02-11 DIAGNOSIS — F411 Generalized anxiety disorder: Secondary | ICD-10-CM | POA: Diagnosis not present

## 2024-02-11 DIAGNOSIS — F341 Dysthymic disorder: Secondary | ICD-10-CM | POA: Diagnosis not present

## 2024-02-15 DIAGNOSIS — F411 Generalized anxiety disorder: Secondary | ICD-10-CM | POA: Diagnosis not present

## 2024-02-15 DIAGNOSIS — F341 Dysthymic disorder: Secondary | ICD-10-CM | POA: Diagnosis not present

## 2024-02-18 DIAGNOSIS — F411 Generalized anxiety disorder: Secondary | ICD-10-CM | POA: Diagnosis not present

## 2024-02-18 DIAGNOSIS — F341 Dysthymic disorder: Secondary | ICD-10-CM | POA: Diagnosis not present

## 2024-02-28 DIAGNOSIS — N926 Irregular menstruation, unspecified: Secondary | ICD-10-CM | POA: Diagnosis not present

## 2024-03-08 DIAGNOSIS — L538 Other specified erythematous conditions: Secondary | ICD-10-CM | POA: Diagnosis not present

## 2024-03-08 DIAGNOSIS — L905 Scar conditions and fibrosis of skin: Secondary | ICD-10-CM | POA: Diagnosis not present

## 2024-03-08 DIAGNOSIS — L980 Pyogenic granuloma: Secondary | ICD-10-CM | POA: Diagnosis not present

## 2024-03-08 DIAGNOSIS — L910 Hypertrophic scar: Secondary | ICD-10-CM | POA: Diagnosis not present

## 2024-04-04 DIAGNOSIS — L0101 Non-bullous impetigo: Secondary | ICD-10-CM | POA: Diagnosis not present

## 2024-04-04 DIAGNOSIS — L910 Hypertrophic scar: Secondary | ICD-10-CM | POA: Diagnosis not present

## 2024-04-04 DIAGNOSIS — L905 Scar conditions and fibrosis of skin: Secondary | ICD-10-CM | POA: Diagnosis not present

## 2024-04-04 DIAGNOSIS — L538 Other specified erythematous conditions: Secondary | ICD-10-CM | POA: Diagnosis not present

## 2024-04-27 DIAGNOSIS — L910 Hypertrophic scar: Secondary | ICD-10-CM | POA: Diagnosis not present

## 2024-04-27 DIAGNOSIS — L905 Scar conditions and fibrosis of skin: Secondary | ICD-10-CM | POA: Diagnosis not present

## 2024-04-27 DIAGNOSIS — L538 Other specified erythematous conditions: Secondary | ICD-10-CM | POA: Diagnosis not present

## 2024-05-22 DIAGNOSIS — F900 Attention-deficit hyperactivity disorder, predominantly inattentive type: Secondary | ICD-10-CM | POA: Diagnosis not present

## 2024-05-22 DIAGNOSIS — F411 Generalized anxiety disorder: Secondary | ICD-10-CM | POA: Diagnosis not present

## 2024-05-22 DIAGNOSIS — F341 Dysthymic disorder: Secondary | ICD-10-CM | POA: Diagnosis not present

## 2024-05-23 DIAGNOSIS — F900 Attention-deficit hyperactivity disorder, predominantly inattentive type: Secondary | ICD-10-CM | POA: Diagnosis not present

## 2024-05-25 DIAGNOSIS — F900 Attention-deficit hyperactivity disorder, predominantly inattentive type: Secondary | ICD-10-CM | POA: Diagnosis not present

## 2024-05-31 DIAGNOSIS — L910 Hypertrophic scar: Secondary | ICD-10-CM | POA: Diagnosis not present

## 2024-06-12 DIAGNOSIS — F411 Generalized anxiety disorder: Secondary | ICD-10-CM | POA: Diagnosis not present

## 2024-06-12 DIAGNOSIS — F341 Dysthymic disorder: Secondary | ICD-10-CM | POA: Diagnosis not present

## 2024-06-12 DIAGNOSIS — F900 Attention-deficit hyperactivity disorder, predominantly inattentive type: Secondary | ICD-10-CM | POA: Diagnosis not present

## 2024-06-28 DIAGNOSIS — N941 Unspecified dyspareunia: Secondary | ICD-10-CM | POA: Diagnosis not present

## 2024-06-28 DIAGNOSIS — N920 Excessive and frequent menstruation with regular cycle: Secondary | ICD-10-CM | POA: Diagnosis not present

## 2024-07-07 DIAGNOSIS — Z630 Problems in relationship with spouse or partner: Secondary | ICD-10-CM | POA: Diagnosis not present

## 2024-07-07 DIAGNOSIS — F4323 Adjustment disorder with mixed anxiety and depressed mood: Secondary | ICD-10-CM | POA: Diagnosis not present

## 2024-07-10 DIAGNOSIS — F900 Attention-deficit hyperactivity disorder, predominantly inattentive type: Secondary | ICD-10-CM | POA: Diagnosis not present

## 2024-07-14 DIAGNOSIS — Z630 Problems in relationship with spouse or partner: Secondary | ICD-10-CM | POA: Diagnosis not present

## 2024-07-14 DIAGNOSIS — F4323 Adjustment disorder with mixed anxiety and depressed mood: Secondary | ICD-10-CM | POA: Diagnosis not present

## 2024-09-20 DIAGNOSIS — F900 Attention-deficit hyperactivity disorder, predominantly inattentive type: Secondary | ICD-10-CM | POA: Diagnosis not present
